# Patient Record
Sex: Female | Born: 1935 | Race: White | Hispanic: No | Marital: Married | Smoking: Never smoker
Health system: Southern US, Community
[De-identification: ages and names within clinical notes are randomized; demographics above are authoritative.]

---

## 2008-11-06 ENCOUNTER — Inpatient Hospital Stay (HOSPITAL_COMMUNITY): Admission: RE | Admit: 2008-11-06 | Discharge: 2008-11-11 | Payer: Self-pay | Admitting: Neurosurgery

## 2010-08-14 LAB — GLUCOSE, CAPILLARY: Glucose-Capillary: 166 mg/dL — ABNORMAL HIGH (ref 70–99)

## 2010-08-15 LAB — CBC
HCT: 43.5 % (ref 36.0–46.0)
Hemoglobin: 14.6 g/dL (ref 12.0–15.0)
MCHC: 33.5 g/dL (ref 30.0–36.0)
MCV: 90.5 fL (ref 78.0–100.0)
Platelets: 296 10*3/uL (ref 150–400)
RDW: 13.6 % (ref 11.5–15.5)

## 2010-08-15 LAB — BASIC METABOLIC PANEL
BUN: 20 mg/dL (ref 6–23)
CO2: 29 mEq/L (ref 19–32)
Chloride: 104 mEq/L (ref 96–112)
GFR calc non Af Amer: >60 mL/min (ref >60)
Glucose, Bld: 115 mg/dL — ABNORMAL HIGH (ref 70–99)
Potassium: 4.4 mEq/L (ref 3.5–5.1)
Sodium: 141 mEq/L (ref 135–145)

## 2010-09-20 NOTE — Op Note (Signed)
NAME:  Alison Oneill, FAHMY NO.:  [id number]   MEDICAL RECORD NO.:  [id number]          PATIENT TYPE:  INP   LOCATION:  3308                         FACILITY:  [facility]   PHYSICIAN:  Danae Orleans. Venetia Maxon, M.D.  DATE OF BIRTH:  [birth year]   DATE OF PROCEDURE:  DATE OF DISCHARGE:                               OPERATIVE REPORT   PREOPERATIVE DIAGNOSES:  Scoliosis, spondylosis, herniated lumbar disk,  stenosis, degenerative disk disease, and radiculopathy L2-3, L3-4, and  L4-5 level.   POSTOPERATIVE DIAGNOSES:  Scoliosis, spondylosis, herniated lumbar disk,  stenosis, degenerative disk disease, and radiculopathy L2-3, L3-4, and  L4-5 level.   PROCEDURES:  1. Anterolateral decompression with interbody grafting.  2. Interbody PEEK cages with bone allograft L2-3, L3-4, L4-5 levels.  3. Pedicle screw fixation (segmental) L2 through L5 bilaterally.  4. Posterolateral arthrodesis L2 through L5 bilaterally.   SURGEON:  Danae Orleans. Venetia Maxon, MD   ASSISTANT:  Georgiann Cocker, RN   ANESTHESIA:  General endotracheal anesthesia.   ESTIMATED BLOOD LOSS:  225 mL.   COMPLICATIONS:  None.   DISPOSITION:  Recovery.   INDICATIONS:  Shemaiah Round is a 77 year old woman with marked  spondylosis, scoliosis, stenosis, disk herniations, and nerve root  compression L2-3, L3-4, and L4-5 levels.  It was elected to take her to  surgery for anterolateral decompression followed by posterior fixation  L2 through L5 levels.   PROCEDURE:  Ms. Futrell was brought to the operating room.  Following  satisfactory and uncomplicated induction of general endotracheal  anesthesia and placement of intravenous lines and Foley catheter, the  patient was placed in the left lateral decubitus position.  His right  side elevated.  She had electrodes placed in L2 through S1 myotomes for  nerve root monitoring.  Using C-arm fluoroscopy in AP and lateral  projections, anterolateral decompression was performed using the  nerve  monitoring and retractor system.  Initially at L4-5, psoas muscle was  entered after making an incision directly overlying the L4-5 level.  With nerve monitoring, there was no evidence of any nerve stimulation.  The disk space was entered.  The self-retaining retractor was placed,  and the interspace was then incised and disk material was removed in  piecemeal fashion.  Endplates were stripped off residual disk material.  The Cobb curette was passed across the disk space and then disk material  was removed.  A 6-mm initial trial was then placed followed by 8-mm  lordotic trial, which appeared to perform indirect decompression of the  interspace and appeared to be well positioned on AP and lateral  projections.  Subsequently, an 8-mm lordotic implant was packed with the  Osteocel Plus, inserting the interspace, and countersunk appropriately.  Attention was then turned to the L3-4 level where similar decompression  was performed and the nerve root monitoring resulted in no neural  stimulation.  Shim was placed and the interspace was incised.  Disk  material was removed in piecemeal fashion and after similar trial and an  8-mm lordotic implant was placed with good indirect decompression of the  neural foramina, the similarly sized implant was placed at  the L2-3  level.  After similar exposure was performed, no evidence of any  abnormal neural stimulation.  The retractor was pushed somewhat caudad  because of the position of the twelfth rib, but using both, we were able  to gain proper exposure at this interspace and removed the disk without  significant difficulty.  A 50 x 8 lordotic graft was placed at this  level after packing with Osteocel Plus.  Hemostasis was assured.  The  incisions were then closed with interrupted Vicryl sutures and the skin  edges were dressed with Dermabond.  The patient was then repositioned in  the prone position on the [place] table taking care to pad all  soft  tissue and bony prominences, and her low back was then prepped and  draped, infiltrated with local lidocaine and incised.  Transverse  processes of L2-L3, L4-L5 were exposed bilaterally and facet joints were  decorticated and facet capsules removed at each of these levels, and the  laminae and posterolateral region from L2 through L5 was decorticated.  Pedicle screw fixation was then placed with 5.5-mm screws, 50 at L5, 45  at L4 and L3, 50 at L2.  All screws appeared to be well positioned.  No  evidence any cutouts.  Their position was confirmed on AP and lateral  fluoroscopy.  Hundred 10-mm straight rods were then lordosed to fit the  screw heads locked down in situ and medium BMP was placed over the  decorticated posterior elements from L2 through L5 bilaterally with  Actifuse placed on the right side and with Actifuse in the superior  aspect and 10 mL of EquivaBone placed in the inferior aspect from the  left side of midline.  The self-retaining retractors were removed.  The  wound had been irrigated prior placing in a final bone graft.  The screw  heads have been torqued down in situ.  The lumbodorsal fascia was closed  with 1 Vicryl sutures, subcutaneous tissues were reapproximated with 2-0  Vicryl interrupted inverted sutures, and skin edges were reapproximated  with 3-0 Vicryl subcuticular stitch.  Wound was dressed with Benzoin,  Steri-Strips, Telfa gauze, and tape.  The patient was extubated in the  operating room, taken to recovery in stable and satisfactory condition  having tolerating the operation well.  Counts were correct at the end of  the case.      Danae Orleans. Venetia Maxon, M.D.  Electronically Signed     Danae Orleans. Venetia Maxon, M.D.  Electronically Signed    JDS/MEDQ  D:  11/06/2008  T:  11/07/2008  Job:  [id number]

## 2010-09-23 NOTE — Discharge Summary (Signed)
NAME:  [name], [name] NO.:  [id number]   MEDICAL RECORD NO.:  [id number]          PATIENT TYPE:  INP   LOCATION:  3005                         FACILITY:  [facility]   PHYSICIAN:  Danae Orleans. Venetia Maxon, M.D.  DATE OF BIRTH:  08/22/1931   DATE OF ADMISSION:  11/06/2008  DATE OF DISCHARGE:  11/11/2008                               DISCHARGE SUMMARY   REASON FOR ADMISSION:  1. Idiopathic scoliosis.  2. Lumbosacral spondylosis.  3. Lumbar disk degeneration.  4. Lumbar disk displacement.  5. Hypertension not otherwise specified.  6. Esophageal reflux.  7. Hypothyroidism.   FINAL DIAGNOSES:  1. Idiopathic scoliosis.  2. Lumbosacral spondylosis.  3. Lumbar disk degeneration.  4. Lumbar disk displacement.  5. Hypertension not otherwise specified.  6. Esophageal reflux.  7. Hypothyroidism.   HISTORY OF ILLNESS AND HOSPITAL COURSE:  Alison Oneill is a 77-year-  old woman with severe low back and bilateral lower extremity pain with  marked lumbar scoliosis, L2-3, L3-4, and L4-5 levels.  The patient was  admitted to the hospital and underwent XLIF procedure at L2-3, L3-4, L4-  5 levels, followed by posterior pedicle screw fixation at L2 through L5  levels.  Postoperatively, she did well.  She was gradually mobilized.  She had decreased back pain and full strength in her lower extremities,  did have some side discomfort which was fairly mild, was gradually  mobilized with physical therapy, and was then discharged home with  instructions to wear back brace when up and follow up in the office 3  weeks postoperatively.      Danae Orleans. Venetia Maxon, M.D.  Electronically Signed     Danae Orleans. Venetia Maxon, M.D.  Electronically Signed    JDS/MEDQ  D:  11/27/2008  T:  11/28/2008  Job:  [id number]

## 2010-11-04 IMAGING — CR DG CHEST 2V
2 series · 2 of 2 positions shown · non-contrast
Comparison: None

CLINICAL DATA: Pre admit for lumbar surgery

CHEST - 2 VIEW

[view not recorded (1 of 2)]
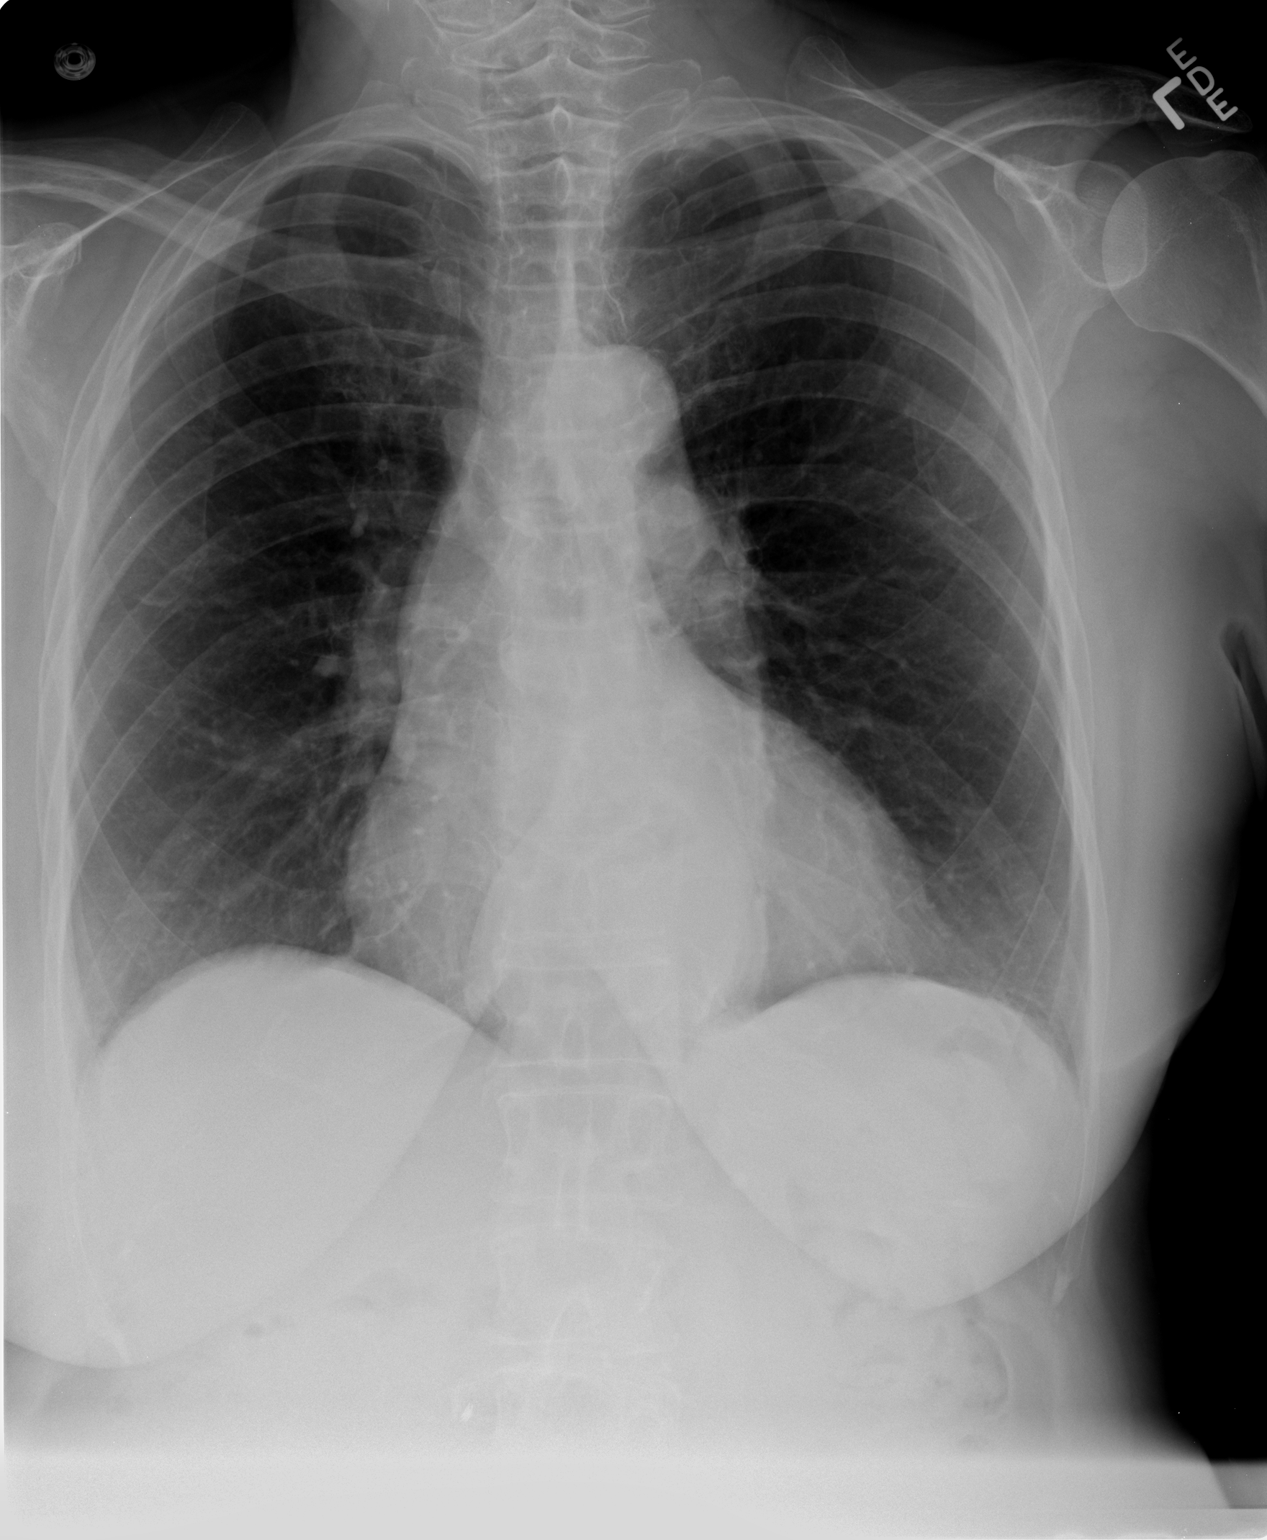

[view not recorded (2 of 2)]
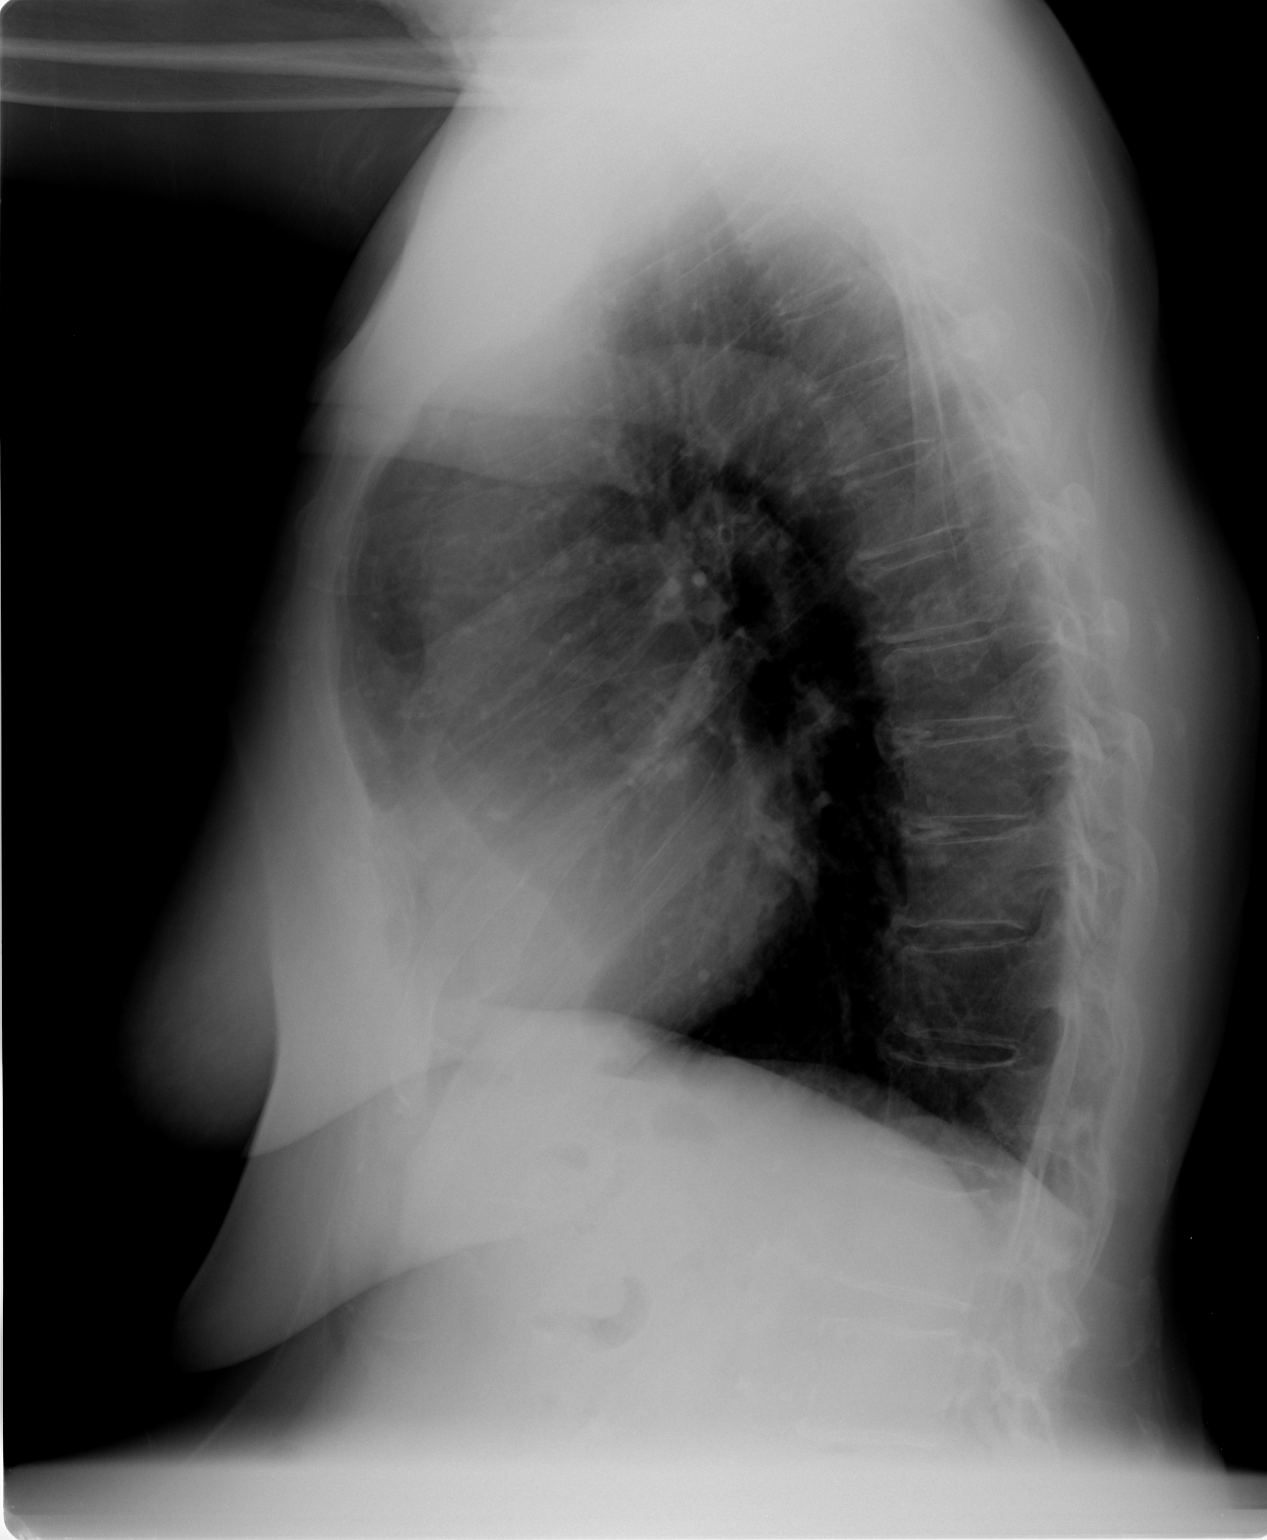

[2 of 2 positions shown; findings below may reference images not displayed]

FINDINGS: Heart mildly enlarged.  Aorta tortuous.  On the PA view,
there is possible aneurysmal dilatation of the lower thoracic
aorta.  This could be an artifact but we have no prior studies for
comparison.  If there are no prior x-rays available on the outside,
I would recommend CT of the chest for further assessment.

There are changes of COPD.  The lungs are clear.
IMPRESSION: 1.  Cardiomegaly and tortuous aorta - lower thoracic aneurysm
cannot be excluded.  See above report.
2.  COPD.  Lungs clear.

## 2010-11-08 IMAGING — CR DG LUMBAR SPINE 1V
1 series · 1 of 1 positions shown · non-contrast
Comparison: Same day

CLINICAL DATA: Lumbar fusion

LUMBAR SPINE - 1 VIEW

[view not recorded]
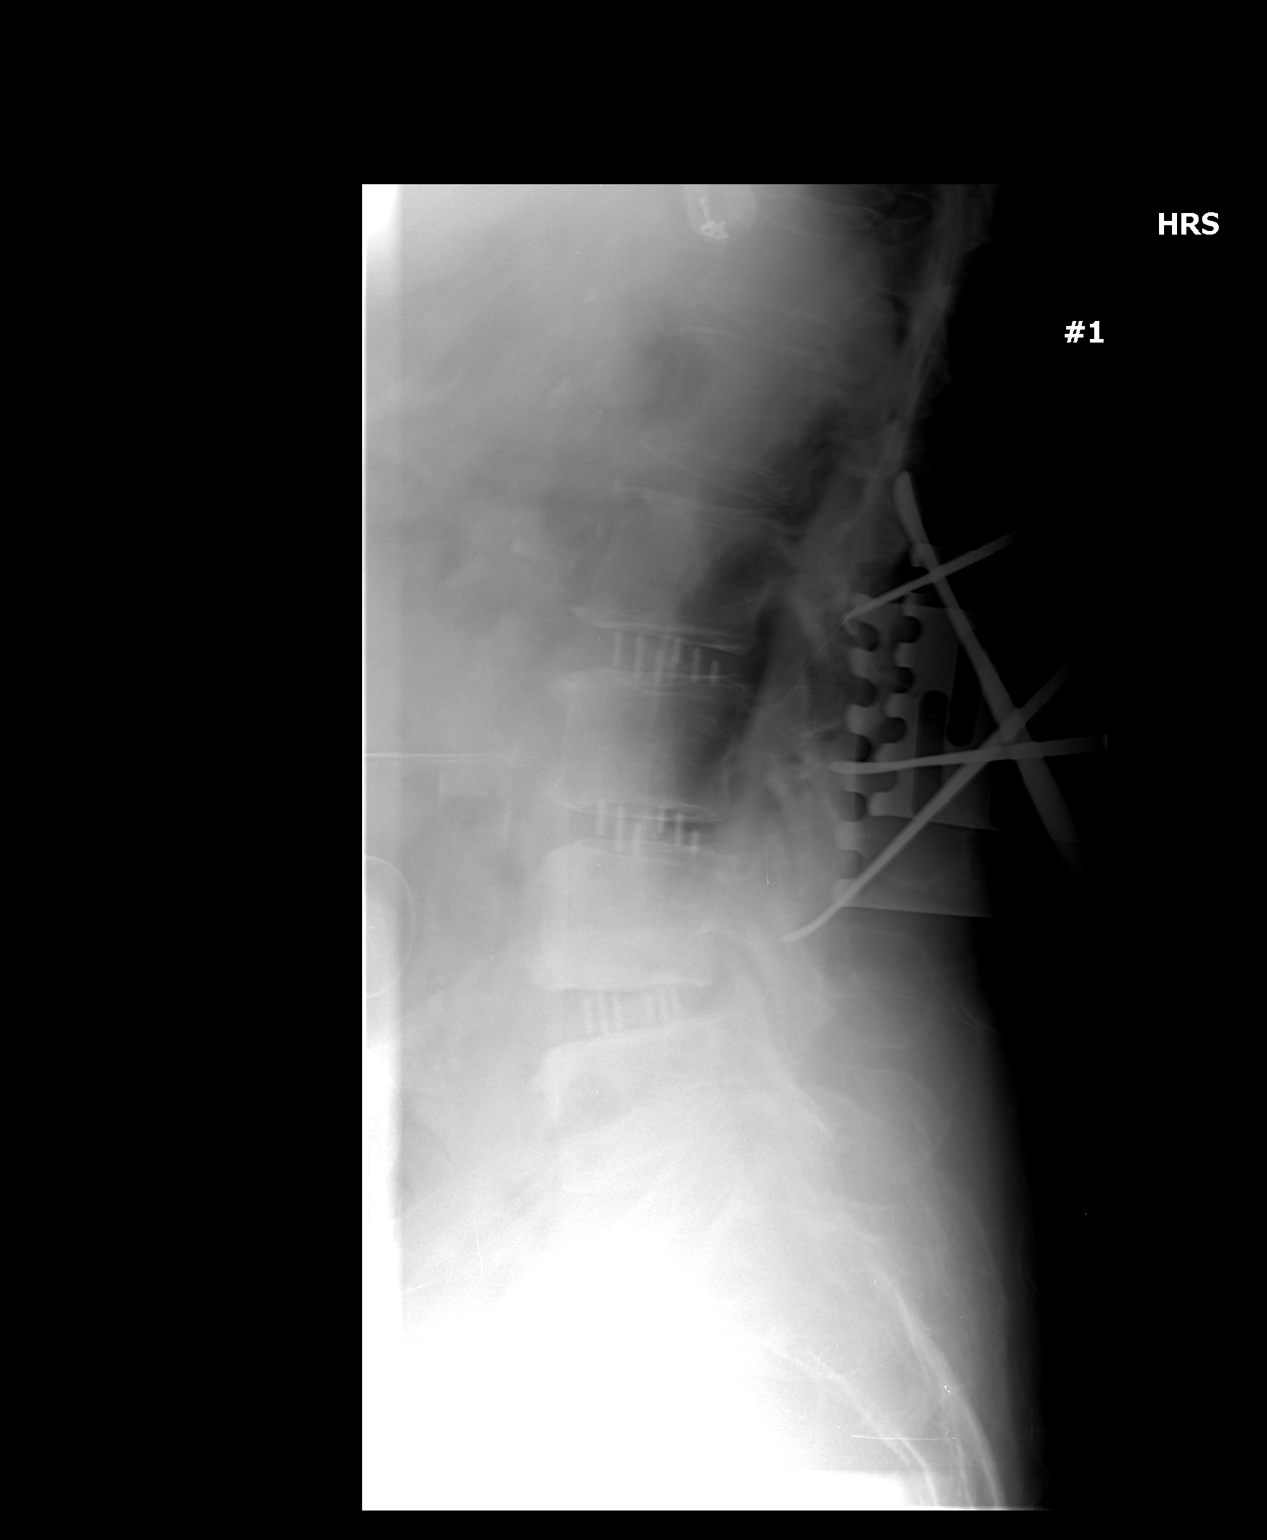

[1 of 1 positions shown; findings below may reference images not displayed]

FINDINGS: Probes are in place at the pedicle levels of L2, L3 and
L4.
IMPRESSION: Pedicle levels of L2, L3 and L4 localized.

## 2014-07-10 DIAGNOSIS — R0609 Other forms of dyspnea: Secondary | ICD-10-CM | POA: Diagnosis not present

## 2014-07-10 DIAGNOSIS — I4891 Unspecified atrial fibrillation: Secondary | ICD-10-CM | POA: Diagnosis not present

## 2014-07-10 DIAGNOSIS — S92309A Fracture of unspecified metatarsal bone(s), unspecified foot, initial encounter for closed fracture: Secondary | ICD-10-CM | POA: Diagnosis not present

## 2014-07-10 DIAGNOSIS — M797 Fibromyalgia: Secondary | ICD-10-CM | POA: Diagnosis not present

## 2014-08-10 DIAGNOSIS — M797 Fibromyalgia: Secondary | ICD-10-CM | POA: Diagnosis not present

## 2014-08-10 DIAGNOSIS — S92309A Fracture of unspecified metatarsal bone(s), unspecified foot, initial encounter for closed fracture: Secondary | ICD-10-CM | POA: Diagnosis not present

## 2014-08-10 DIAGNOSIS — R0609 Other forms of dyspnea: Secondary | ICD-10-CM | POA: Diagnosis not present

## 2014-08-10 DIAGNOSIS — I4891 Unspecified atrial fibrillation: Secondary | ICD-10-CM | POA: Diagnosis not present

## 2014-08-17 DIAGNOSIS — S92309A Fracture of unspecified metatarsal bone(s), unspecified foot, initial encounter for closed fracture: Secondary | ICD-10-CM | POA: Diagnosis not present

## 2014-08-25 DIAGNOSIS — R609 Edema, unspecified: Secondary | ICD-10-CM | POA: Diagnosis not present

## 2014-08-25 DIAGNOSIS — I10 Essential (primary) hypertension: Secondary | ICD-10-CM | POA: Diagnosis not present

## 2014-08-25 DIAGNOSIS — M1990 Unspecified osteoarthritis, unspecified site: Secondary | ICD-10-CM | POA: Diagnosis not present

## 2014-08-25 DIAGNOSIS — N390 Urinary tract infection, site not specified: Secondary | ICD-10-CM | POA: Diagnosis not present

## 2014-08-25 DIAGNOSIS — Z6829 Body mass index (BMI) 29.0-29.9, adult: Secondary | ICD-10-CM | POA: Diagnosis not present

## 2014-09-09 DIAGNOSIS — M797 Fibromyalgia: Secondary | ICD-10-CM | POA: Diagnosis not present

## 2014-09-09 DIAGNOSIS — R0609 Other forms of dyspnea: Secondary | ICD-10-CM | POA: Diagnosis not present

## 2014-09-09 DIAGNOSIS — I4891 Unspecified atrial fibrillation: Secondary | ICD-10-CM | POA: Diagnosis not present

## 2014-09-09 DIAGNOSIS — S92309A Fracture of unspecified metatarsal bone(s), unspecified foot, initial encounter for closed fracture: Secondary | ICD-10-CM | POA: Diagnosis not present

## 2014-09-28 DIAGNOSIS — M2140 Flat foot [pes planus] (acquired), unspecified foot: Secondary | ICD-10-CM | POA: Diagnosis not present

## 2014-10-27 DIAGNOSIS — Z6828 Body mass index (BMI) 28.0-28.9, adult: Secondary | ICD-10-CM | POA: Diagnosis not present

## 2014-10-27 DIAGNOSIS — I4891 Unspecified atrial fibrillation: Secondary | ICD-10-CM | POA: Diagnosis not present

## 2014-10-27 DIAGNOSIS — N3020 Other chronic cystitis without hematuria: Secondary | ICD-10-CM | POA: Diagnosis not present

## 2014-10-27 DIAGNOSIS — I10 Essential (primary) hypertension: Secondary | ICD-10-CM | POA: Diagnosis not present

## 2014-11-09 DIAGNOSIS — S92309A Fracture of unspecified metatarsal bone(s), unspecified foot, initial encounter for closed fracture: Secondary | ICD-10-CM | POA: Diagnosis not present

## 2014-11-09 DIAGNOSIS — I4891 Unspecified atrial fibrillation: Secondary | ICD-10-CM | POA: Diagnosis not present

## 2014-11-09 DIAGNOSIS — R0609 Other forms of dyspnea: Secondary | ICD-10-CM | POA: Diagnosis not present

## 2014-11-09 DIAGNOSIS — M797 Fibromyalgia: Secondary | ICD-10-CM | POA: Diagnosis not present

## 2014-11-23 DIAGNOSIS — S92352D Displaced fracture of fifth metatarsal bone, left foot, subsequent encounter for fracture with routine healing: Secondary | ICD-10-CM | POA: Diagnosis not present

## 2014-12-01 DIAGNOSIS — Z13820 Encounter for screening for osteoporosis: Secondary | ICD-10-CM | POA: Diagnosis not present

## 2014-12-01 DIAGNOSIS — Z780 Asymptomatic menopausal state: Secondary | ICD-10-CM | POA: Diagnosis not present

## 2014-12-28 DIAGNOSIS — S92352D Displaced fracture of fifth metatarsal bone, left foot, subsequent encounter for fracture with routine healing: Secondary | ICD-10-CM | POA: Diagnosis not present

## 2015-01-10 DIAGNOSIS — S92309A Fracture of unspecified metatarsal bone(s), unspecified foot, initial encounter for closed fracture: Secondary | ICD-10-CM | POA: Diagnosis not present

## 2015-01-10 DIAGNOSIS — R0609 Other forms of dyspnea: Secondary | ICD-10-CM | POA: Diagnosis not present

## 2015-01-10 DIAGNOSIS — M797 Fibromyalgia: Secondary | ICD-10-CM | POA: Diagnosis not present

## 2015-01-10 DIAGNOSIS — I4891 Unspecified atrial fibrillation: Secondary | ICD-10-CM | POA: Diagnosis not present

## 2015-01-22 DIAGNOSIS — G629 Polyneuropathy, unspecified: Secondary | ICD-10-CM | POA: Diagnosis not present

## 2015-01-22 DIAGNOSIS — Z139 Encounter for screening, unspecified: Secondary | ICD-10-CM | POA: Diagnosis not present

## 2015-01-22 DIAGNOSIS — E039 Hypothyroidism, unspecified: Secondary | ICD-10-CM | POA: Diagnosis not present

## 2015-01-22 DIAGNOSIS — I10 Essential (primary) hypertension: Secondary | ICD-10-CM | POA: Diagnosis not present

## 2015-01-22 DIAGNOSIS — M1990 Unspecified osteoarthritis, unspecified site: Secondary | ICD-10-CM | POA: Diagnosis not present

## 2015-01-22 DIAGNOSIS — I4891 Unspecified atrial fibrillation: Secondary | ICD-10-CM | POA: Diagnosis not present

## 2015-01-22 DIAGNOSIS — E785 Hyperlipidemia, unspecified: Secondary | ICD-10-CM | POA: Diagnosis not present

## 2015-02-09 DIAGNOSIS — S92309A Fracture of unspecified metatarsal bone(s), unspecified foot, initial encounter for closed fracture: Secondary | ICD-10-CM | POA: Diagnosis not present

## 2015-02-09 DIAGNOSIS — I4891 Unspecified atrial fibrillation: Secondary | ICD-10-CM | POA: Diagnosis not present

## 2015-02-09 DIAGNOSIS — M797 Fibromyalgia: Secondary | ICD-10-CM | POA: Diagnosis not present

## 2015-02-09 DIAGNOSIS — R0609 Other forms of dyspnea: Secondary | ICD-10-CM | POA: Diagnosis not present

## 2015-03-12 DIAGNOSIS — I4891 Unspecified atrial fibrillation: Secondary | ICD-10-CM | POA: Diagnosis not present

## 2015-03-12 DIAGNOSIS — M797 Fibromyalgia: Secondary | ICD-10-CM | POA: Diagnosis not present

## 2015-03-12 DIAGNOSIS — S92309A Fracture of unspecified metatarsal bone(s), unspecified foot, initial encounter for closed fracture: Secondary | ICD-10-CM | POA: Diagnosis not present

## 2015-03-12 DIAGNOSIS — R0609 Other forms of dyspnea: Secondary | ICD-10-CM | POA: Diagnosis not present

## 2015-03-30 DIAGNOSIS — G629 Polyneuropathy, unspecified: Secondary | ICD-10-CM | POA: Diagnosis not present

## 2015-03-30 DIAGNOSIS — M1990 Unspecified osteoarthritis, unspecified site: Secondary | ICD-10-CM | POA: Diagnosis not present

## 2015-03-30 DIAGNOSIS — I4891 Unspecified atrial fibrillation: Secondary | ICD-10-CM | POA: Diagnosis not present

## 2015-03-30 DIAGNOSIS — Z1389 Encounter for screening for other disorder: Secondary | ICD-10-CM | POA: Diagnosis not present

## 2015-03-30 DIAGNOSIS — N3020 Other chronic cystitis without hematuria: Secondary | ICD-10-CM | POA: Diagnosis not present

## 2015-04-11 DIAGNOSIS — S92309A Fracture of unspecified metatarsal bone(s), unspecified foot, initial encounter for closed fracture: Secondary | ICD-10-CM | POA: Diagnosis not present

## 2015-04-11 DIAGNOSIS — R0609 Other forms of dyspnea: Secondary | ICD-10-CM | POA: Diagnosis not present

## 2015-04-11 DIAGNOSIS — M797 Fibromyalgia: Secondary | ICD-10-CM | POA: Diagnosis not present

## 2015-04-11 DIAGNOSIS — I4891 Unspecified atrial fibrillation: Secondary | ICD-10-CM | POA: Diagnosis not present

## 2015-06-01 DIAGNOSIS — M1990 Unspecified osteoarthritis, unspecified site: Secondary | ICD-10-CM | POA: Diagnosis not present

## 2015-06-01 DIAGNOSIS — E039 Hypothyroidism, unspecified: Secondary | ICD-10-CM | POA: Diagnosis not present

## 2015-06-01 DIAGNOSIS — B0229 Other postherpetic nervous system involvement: Secondary | ICD-10-CM | POA: Diagnosis not present

## 2015-06-01 DIAGNOSIS — R0600 Dyspnea, unspecified: Secondary | ICD-10-CM | POA: Diagnosis not present

## 2015-06-01 DIAGNOSIS — E785 Hyperlipidemia, unspecified: Secondary | ICD-10-CM | POA: Diagnosis not present

## 2015-06-01 DIAGNOSIS — Z6830 Body mass index (BMI) 30.0-30.9, adult: Secondary | ICD-10-CM | POA: Diagnosis not present

## 2015-06-01 DIAGNOSIS — I10 Essential (primary) hypertension: Secondary | ICD-10-CM | POA: Diagnosis not present

## 2015-06-01 DIAGNOSIS — I4891 Unspecified atrial fibrillation: Secondary | ICD-10-CM | POA: Diagnosis not present

## 2015-06-09 DIAGNOSIS — I340 Nonrheumatic mitral (valve) insufficiency: Secondary | ICD-10-CM | POA: Diagnosis not present

## 2015-06-09 DIAGNOSIS — I4891 Unspecified atrial fibrillation: Secondary | ICD-10-CM | POA: Diagnosis not present

## 2015-06-29 DIAGNOSIS — H353131 Nonexudative age-related macular degeneration, bilateral, early dry stage: Secondary | ICD-10-CM | POA: Diagnosis not present

## 2015-06-29 DIAGNOSIS — H4720 Unspecified optic atrophy: Secondary | ICD-10-CM | POA: Diagnosis not present

## 2015-06-29 DIAGNOSIS — H04123 Dry eye syndrome of bilateral lacrimal glands: Secondary | ICD-10-CM | POA: Diagnosis not present

## 2015-06-29 DIAGNOSIS — H26493 Other secondary cataract, bilateral: Secondary | ICD-10-CM | POA: Diagnosis not present

## 2015-07-02 DIAGNOSIS — I10 Essential (primary) hypertension: Secondary | ICD-10-CM | POA: Diagnosis not present

## 2015-07-02 DIAGNOSIS — Z6829 Body mass index (BMI) 29.0-29.9, adult: Secondary | ICD-10-CM | POA: Diagnosis not present

## 2015-07-02 DIAGNOSIS — R0600 Dyspnea, unspecified: Secondary | ICD-10-CM | POA: Diagnosis not present

## 2015-07-02 DIAGNOSIS — R609 Edema, unspecified: Secondary | ICD-10-CM | POA: Diagnosis not present

## 2015-08-30 DIAGNOSIS — I10 Essential (primary) hypertension: Secondary | ICD-10-CM | POA: Diagnosis not present

## 2015-08-30 DIAGNOSIS — I4891 Unspecified atrial fibrillation: Secondary | ICD-10-CM | POA: Diagnosis not present

## 2015-08-30 DIAGNOSIS — R069 Unspecified abnormalities of breathing: Secondary | ICD-10-CM | POA: Diagnosis not present

## 2015-08-30 DIAGNOSIS — Z6829 Body mass index (BMI) 29.0-29.9, adult: Secondary | ICD-10-CM | POA: Diagnosis not present

## 2015-08-30 DIAGNOSIS — N3020 Other chronic cystitis without hematuria: Secondary | ICD-10-CM | POA: Diagnosis not present

## 2015-08-30 DIAGNOSIS — M1990 Unspecified osteoarthritis, unspecified site: Secondary | ICD-10-CM | POA: Diagnosis not present

## 2015-10-20 DIAGNOSIS — Z6830 Body mass index (BMI) 30.0-30.9, adult: Secondary | ICD-10-CM | POA: Diagnosis not present

## 2015-10-20 DIAGNOSIS — N390 Urinary tract infection, site not specified: Secondary | ICD-10-CM | POA: Diagnosis not present

## 2015-12-07 DIAGNOSIS — D485 Neoplasm of uncertain behavior of skin: Secondary | ICD-10-CM | POA: Diagnosis not present

## 2015-12-07 DIAGNOSIS — L570 Actinic keratosis: Secondary | ICD-10-CM | POA: Diagnosis not present

## 2015-12-14 DIAGNOSIS — R609 Edema, unspecified: Secondary | ICD-10-CM | POA: Diagnosis not present

## 2015-12-14 DIAGNOSIS — Z7901 Long term (current) use of anticoagulants: Secondary | ICD-10-CM | POA: Diagnosis not present

## 2015-12-14 DIAGNOSIS — Z6829 Body mass index (BMI) 29.0-29.9, adult: Secondary | ICD-10-CM | POA: Diagnosis not present

## 2015-12-14 DIAGNOSIS — I10 Essential (primary) hypertension: Secondary | ICD-10-CM | POA: Diagnosis not present

## 2015-12-14 DIAGNOSIS — E785 Hyperlipidemia, unspecified: Secondary | ICD-10-CM | POA: Diagnosis not present

## 2016-02-15 DIAGNOSIS — Z9181 History of falling: Secondary | ICD-10-CM | POA: Diagnosis not present

## 2016-02-15 DIAGNOSIS — M1990 Unspecified osteoarthritis, unspecified site: Secondary | ICD-10-CM | POA: Diagnosis not present

## 2016-02-15 DIAGNOSIS — I482 Chronic atrial fibrillation: Secondary | ICD-10-CM | POA: Diagnosis not present

## 2016-02-15 DIAGNOSIS — I10 Essential (primary) hypertension: Secondary | ICD-10-CM | POA: Diagnosis not present

## 2016-05-16 DIAGNOSIS — M1990 Unspecified osteoarthritis, unspecified site: Secondary | ICD-10-CM | POA: Diagnosis not present

## 2016-05-16 DIAGNOSIS — Z0000 Encounter for general adult medical examination without abnormal findings: Secondary | ICD-10-CM | POA: Diagnosis not present

## 2016-05-16 DIAGNOSIS — E785 Hyperlipidemia, unspecified: Secondary | ICD-10-CM | POA: Diagnosis not present

## 2016-05-16 DIAGNOSIS — I482 Chronic atrial fibrillation: Secondary | ICD-10-CM | POA: Diagnosis not present

## 2016-05-16 DIAGNOSIS — E039 Hypothyroidism, unspecified: Secondary | ICD-10-CM | POA: Diagnosis not present

## 2016-05-16 DIAGNOSIS — B0229 Other postherpetic nervous system involvement: Secondary | ICD-10-CM | POA: Diagnosis not present

## 2016-05-16 DIAGNOSIS — I10 Essential (primary) hypertension: Secondary | ICD-10-CM | POA: Diagnosis not present

## 2016-05-16 DIAGNOSIS — R609 Edema, unspecified: Secondary | ICD-10-CM | POA: Diagnosis not present

## 2016-05-16 DIAGNOSIS — Z1389 Encounter for screening for other disorder: Secondary | ICD-10-CM | POA: Diagnosis not present

## 2016-05-22 DIAGNOSIS — N390 Urinary tract infection, site not specified: Secondary | ICD-10-CM | POA: Diagnosis not present

## 2016-07-20 DIAGNOSIS — N3020 Other chronic cystitis without hematuria: Secondary | ICD-10-CM | POA: Diagnosis not present

## 2016-07-20 DIAGNOSIS — Z6831 Body mass index (BMI) 31.0-31.9, adult: Secondary | ICD-10-CM | POA: Diagnosis not present

## 2016-07-20 DIAGNOSIS — B0229 Other postherpetic nervous system involvement: Secondary | ICD-10-CM | POA: Diagnosis not present

## 2016-07-20 DIAGNOSIS — I482 Chronic atrial fibrillation: Secondary | ICD-10-CM | POA: Diagnosis not present

## 2016-07-20 DIAGNOSIS — M1990 Unspecified osteoarthritis, unspecified site: Secondary | ICD-10-CM | POA: Diagnosis not present

## 2016-07-20 DIAGNOSIS — M170 Bilateral primary osteoarthritis of knee: Secondary | ICD-10-CM | POA: Diagnosis not present

## 2016-07-20 DIAGNOSIS — I10 Essential (primary) hypertension: Secondary | ICD-10-CM | POA: Diagnosis not present

## 2016-07-31 DIAGNOSIS — I878 Other specified disorders of veins: Secondary | ICD-10-CM | POA: Diagnosis not present

## 2016-07-31 DIAGNOSIS — M1711 Unilateral primary osteoarthritis, right knee: Secondary | ICD-10-CM | POA: Diagnosis not present

## 2016-07-31 DIAGNOSIS — M25561 Pain in right knee: Secondary | ICD-10-CM | POA: Diagnosis not present

## 2016-07-31 DIAGNOSIS — M216X1 Other acquired deformities of right foot: Secondary | ICD-10-CM | POA: Diagnosis not present

## 2016-08-28 DIAGNOSIS — L97222 Non-pressure chronic ulcer of left calf with fat layer exposed: Secondary | ICD-10-CM | POA: Diagnosis not present

## 2016-08-28 DIAGNOSIS — I872 Venous insufficiency (chronic) (peripheral): Secondary | ICD-10-CM | POA: Diagnosis not present

## 2016-09-04 DIAGNOSIS — L97212 Non-pressure chronic ulcer of right calf with fat layer exposed: Secondary | ICD-10-CM | POA: Diagnosis not present

## 2016-09-04 DIAGNOSIS — I872 Venous insufficiency (chronic) (peripheral): Secondary | ICD-10-CM | POA: Diagnosis not present

## 2016-09-11 DIAGNOSIS — L97212 Non-pressure chronic ulcer of right calf with fat layer exposed: Secondary | ICD-10-CM | POA: Diagnosis not present

## 2016-09-11 DIAGNOSIS — I872 Venous insufficiency (chronic) (peripheral): Secondary | ICD-10-CM | POA: Diagnosis not present

## 2016-09-18 DIAGNOSIS — I872 Venous insufficiency (chronic) (peripheral): Secondary | ICD-10-CM | POA: Diagnosis not present

## 2016-09-18 DIAGNOSIS — L97212 Non-pressure chronic ulcer of right calf with fat layer exposed: Secondary | ICD-10-CM | POA: Diagnosis not present

## 2016-09-25 DIAGNOSIS — B0229 Other postherpetic nervous system involvement: Secondary | ICD-10-CM | POA: Diagnosis not present

## 2016-09-25 DIAGNOSIS — I872 Venous insufficiency (chronic) (peripheral): Secondary | ICD-10-CM | POA: Diagnosis not present

## 2016-09-25 DIAGNOSIS — M1990 Unspecified osteoarthritis, unspecified site: Secondary | ICD-10-CM | POA: Diagnosis not present

## 2016-09-25 DIAGNOSIS — L03113 Cellulitis of right upper limb: Secondary | ICD-10-CM | POA: Diagnosis not present

## 2016-09-25 DIAGNOSIS — I482 Chronic atrial fibrillation: Secondary | ICD-10-CM | POA: Diagnosis not present

## 2016-09-25 DIAGNOSIS — Z139 Encounter for screening, unspecified: Secondary | ICD-10-CM | POA: Diagnosis not present

## 2016-09-25 DIAGNOSIS — I10 Essential (primary) hypertension: Secondary | ICD-10-CM | POA: Diagnosis not present

## 2016-09-25 DIAGNOSIS — L97212 Non-pressure chronic ulcer of right calf with fat layer exposed: Secondary | ICD-10-CM | POA: Diagnosis not present

## 2016-10-16 DIAGNOSIS — L97212 Non-pressure chronic ulcer of right calf with fat layer exposed: Secondary | ICD-10-CM | POA: Diagnosis not present

## 2016-10-16 DIAGNOSIS — I872 Venous insufficiency (chronic) (peripheral): Secondary | ICD-10-CM | POA: Diagnosis not present

## 2016-12-04 DIAGNOSIS — B0229 Other postherpetic nervous system involvement: Secondary | ICD-10-CM | POA: Diagnosis not present

## 2016-12-04 DIAGNOSIS — I482 Chronic atrial fibrillation: Secondary | ICD-10-CM | POA: Diagnosis not present

## 2016-12-04 DIAGNOSIS — Z139 Encounter for screening, unspecified: Secondary | ICD-10-CM | POA: Diagnosis not present

## 2016-12-04 DIAGNOSIS — M79672 Pain in left foot: Secondary | ICD-10-CM | POA: Diagnosis not present

## 2016-12-04 DIAGNOSIS — R300 Dysuria: Secondary | ICD-10-CM | POA: Diagnosis not present

## 2016-12-27 DIAGNOSIS — M25561 Pain in right knee: Secondary | ICD-10-CM | POA: Diagnosis not present

## 2016-12-27 DIAGNOSIS — M1711 Unilateral primary osteoarthritis, right knee: Secondary | ICD-10-CM | POA: Diagnosis not present

## 2016-12-28 ENCOUNTER — Ambulatory Visit (INDEPENDENT_AMBULATORY_CARE_PROVIDER_SITE_OTHER): Payer: PPO | Admitting: Sports Medicine

## 2016-12-28 ENCOUNTER — Encounter (INDEPENDENT_AMBULATORY_CARE_PROVIDER_SITE_OTHER): Payer: Self-pay

## 2016-12-28 ENCOUNTER — Encounter: Payer: Self-pay | Admitting: Sports Medicine

## 2016-12-28 VITALS — Ht 68.0 in | Wt 200.0 lb

## 2016-12-28 DIAGNOSIS — L603 Nail dystrophy: Secondary | ICD-10-CM

## 2016-12-28 DIAGNOSIS — I890 Lymphedema, not elsewhere classified: Secondary | ICD-10-CM | POA: Diagnosis not present

## 2016-12-28 DIAGNOSIS — M792 Neuralgia and neuritis, unspecified: Secondary | ICD-10-CM | POA: Diagnosis not present

## 2016-12-28 DIAGNOSIS — M79672 Pain in left foot: Secondary | ICD-10-CM | POA: Diagnosis not present

## 2016-12-28 DIAGNOSIS — M79671 Pain in right foot: Secondary | ICD-10-CM | POA: Diagnosis not present

## 2016-12-28 DIAGNOSIS — L84 Corns and callosities: Secondary | ICD-10-CM

## 2016-12-29 ENCOUNTER — Telehealth: Payer: Self-pay | Admitting: *Deleted

## 2016-12-29 DIAGNOSIS — R609 Edema, unspecified: Secondary | ICD-10-CM

## 2016-12-29 DIAGNOSIS — R0989 Other specified symptoms and signs involving the circulatory and respiratory systems: Secondary | ICD-10-CM

## 2016-12-29 NOTE — Progress Notes (Signed)
Subjective: Alison Oneill is a 83 y.o. female patient who presents to office for evaluation of swelling, callus, and bilateral foot pain. Patient complains of burning pain to both feet and legs. Currently on Lyrica. However states that her legs still hurt and bother her. States that her legs are always swollen and that she gets rubbing on the bottom of her left foot when she does try to walk, however, she spends a majority time in the wheelchair. Patient denies a history of diabetes. No other acute issues noted.  Patient is assisted by husband.  There are no active problems to display for this patient.   No current outpatient prescriptions on file prior to visit.   No current facility-administered medications on file prior to visit.     Allergies  Allergen Reactions  . Codeine Other (See Comments)    Unknown  . Iodinated Diagnostic Agents Other (See Comments)    Unknown  . Pravastatin Other (See Comments)    Unknown  . Rosuvastatin Other (See Comments)    Unknown    Objective:  General: Alert and oriented x3 in no acute distressIn wheelchair  Dermatology: No open lesions bilateral lower extremities, no webspace macerations, no ecchymosis bilateral, all nails x 10 are mildly elongated. There is mild Pre-ulcerative callus sub-met 5 on left with no surrounding signs of infection.  Vascular: Dorsalis Pedis and Posterior Tibial pedal pulses are not palpable, Capillary Fill Time 5seconds,(-) pedal hair growth bilateral, 2+ edema bilateral lower extremities with trophic skin changes and induration suggestive of lymphedema, Temperature gradient within normal limits.  Neurology: Gross sensation intact via light touch bilateral, Protective sensation diminished with Thornell Mule Monofilament to all pedal sites, vibratory absent bilateral. Subjective burning pain to both feet and legs.  Musculoskeletal: Mild tenderness with palpation at callus on left foot,No pain with calf compression  bilateral. Pes planus foot type bilateral. Strength within 4 out of 5 in all groups bilateral.   Assessment and Plan: Problem List Items Addressed This Visit    None    Visit Diagnoses    Lymphedema of both lower extremities    -  Primary   Neuritis       Foot pain, bilateral       Nail dystrophy       Callus           -Complete examination performed -Discussed treatement options for lymphedema in both lower extremities with likely secondary nerve pain due to all the displaced fluid and edema -Continue with Lyrica as given by PCP -Rx vascular studies. Advised patient that she may be a good candidate for Profore dressings or edema pump pending results of vascular studies and condition of her heart. -Complementary trim callus sub-met 5 on left without incident using a sterile chisel blade and advised patient that for her nail trim this may not be a covered service Patient reviewed [facility] for nail trim and decided that she would hold off on nail trim at this time. -Continue with good supportive shoes that do not provide any rubbing or irritation to her, swollen feet -Patient to return to office after vascular studies or sooner if condition worsens.  Landis Martins, DPM

## 2016-12-29 NOTE — Telephone Encounter (Addendum)
-----   Message from Landis Martins, [place] sent at 12/28/2016  3:11 PM EDT ----- Regarding: Vascular studies  ABIs and venous reflux. Chronic edema bilateral with pain  -Dr. Cannon Kettle. 12/29/2016-Orders faxed to [facility]. Faxed required form, demographics and clinicals to [employer] for prior approval.

## 2017-01-12 DIAGNOSIS — M1711 Unilateral primary osteoarthritis, right knee: Secondary | ICD-10-CM | POA: Diagnosis not present

## 2017-02-05 DEATH — deceased
